# Patient Record
Sex: Female | Born: 1955 | Race: White | Hispanic: No
Health system: Southern US, Community
[De-identification: ages and names within clinical notes are randomized; demographics above are authoritative.]

---

## 2013-10-18 ENCOUNTER — Other Ambulatory Visit: Payer: Self-pay

## 2013-10-18 DIAGNOSIS — Z1231 Encounter for screening mammogram for malignant neoplasm of breast: Secondary | ICD-10-CM

## 2014-05-21 ENCOUNTER — Ambulatory Visit
Admission: RE | Admit: 2014-05-21 | Discharge: 2014-05-21 | Disposition: A | Discharge status: Home / Self Care | Payer: BLUE CROSS/BLUE SHIELD | Source: Ambulatory Visit

## 2014-05-21 DIAGNOSIS — Z1231 Encounter for screening mammogram for malignant neoplasm of breast: Secondary | ICD-10-CM

## 2016-02-23 IMAGING — MG MM SCREENING BREAST TOMO BILATERAL
12 series · 12 of 28 positions shown · non-contrast
Comparison: Previous exam(s).

CLINICAL DATA: Screening.

EXAM:
DIGITAL SCREENING BILATERAL MAMMOGRAM WITH 3D TOMO WITH CAD

[R MLO]
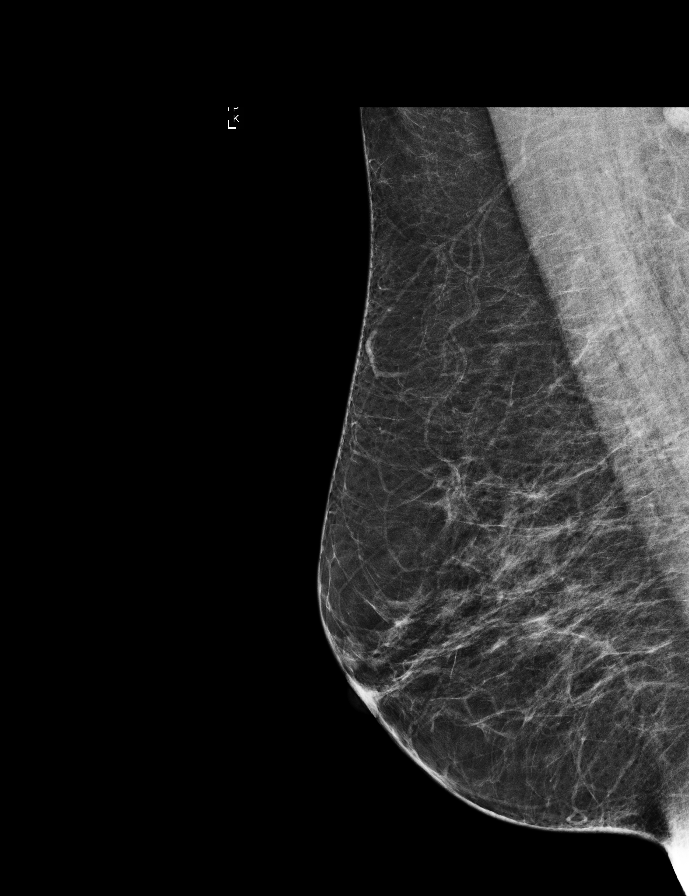

[R CC]
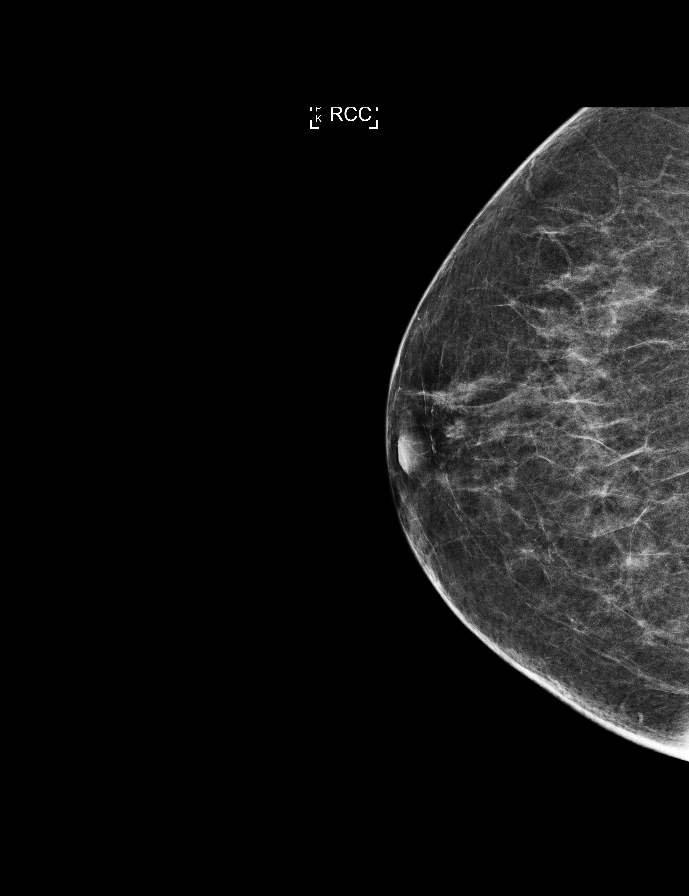

[L MLO]
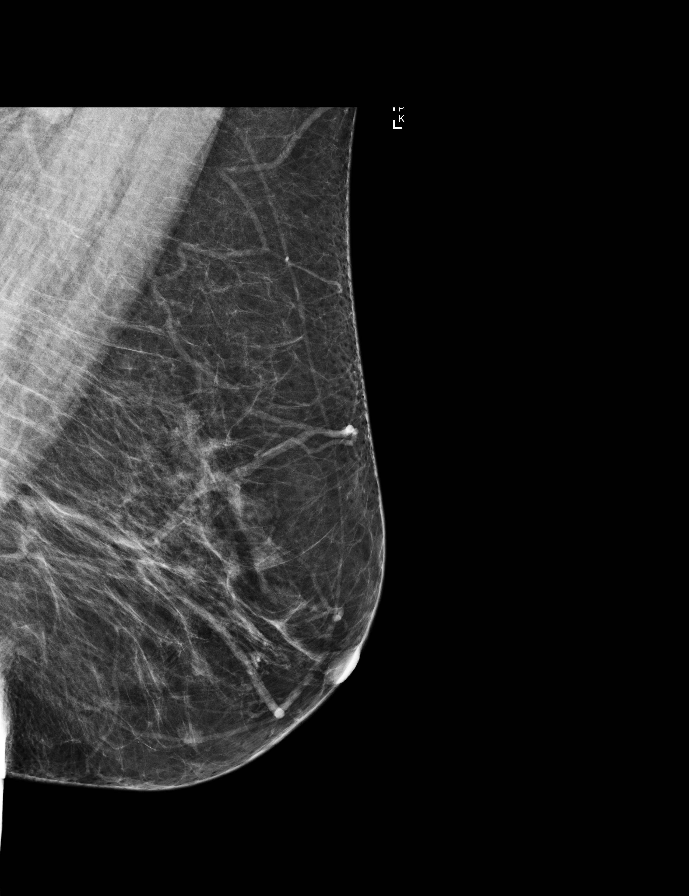

[L CC]
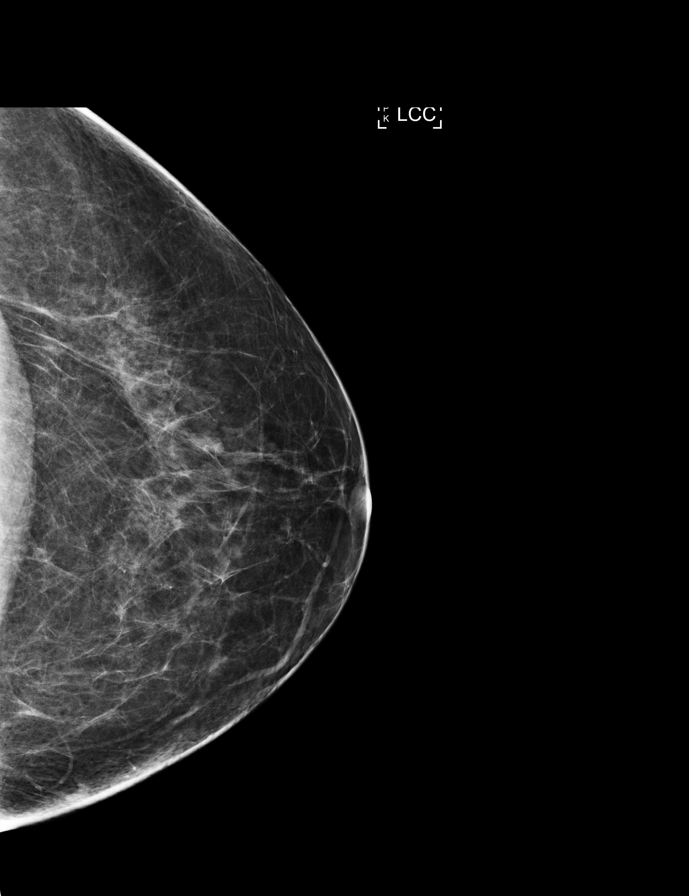

[L CC tomo (1 of 2)]
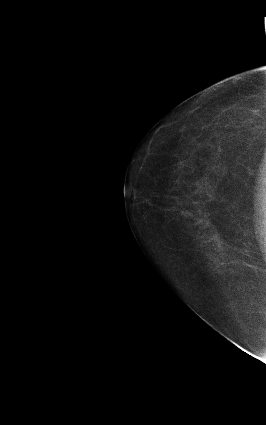

[R CC tomo (1 of 2)]
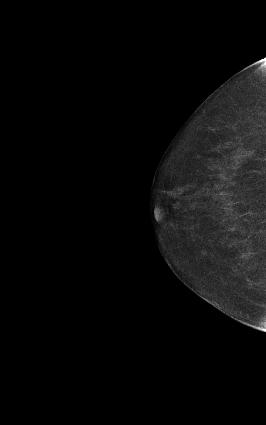

[L MLO tomo (1 of 2)]
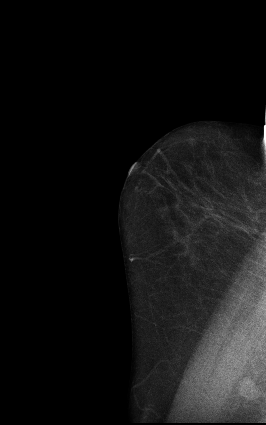

[R MLO tomo (1 of 2)]
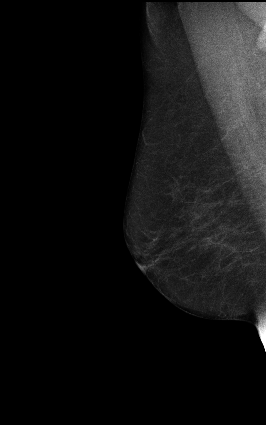

[R MLO tomo (2 of 2) · tomo slice 33/65.0]
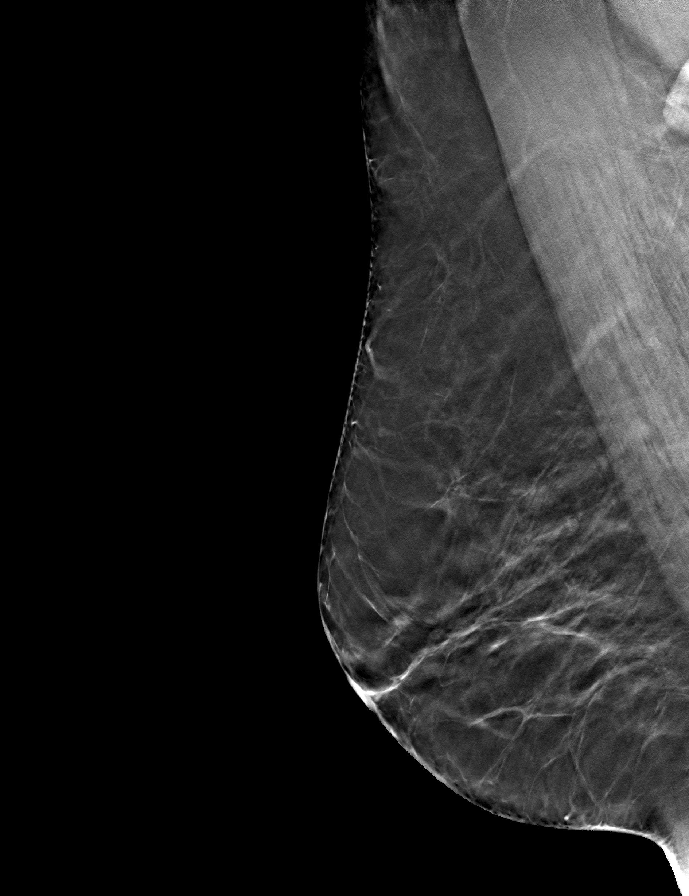

[L CC tomo (2 of 2) · tomo slice 35/69.0]
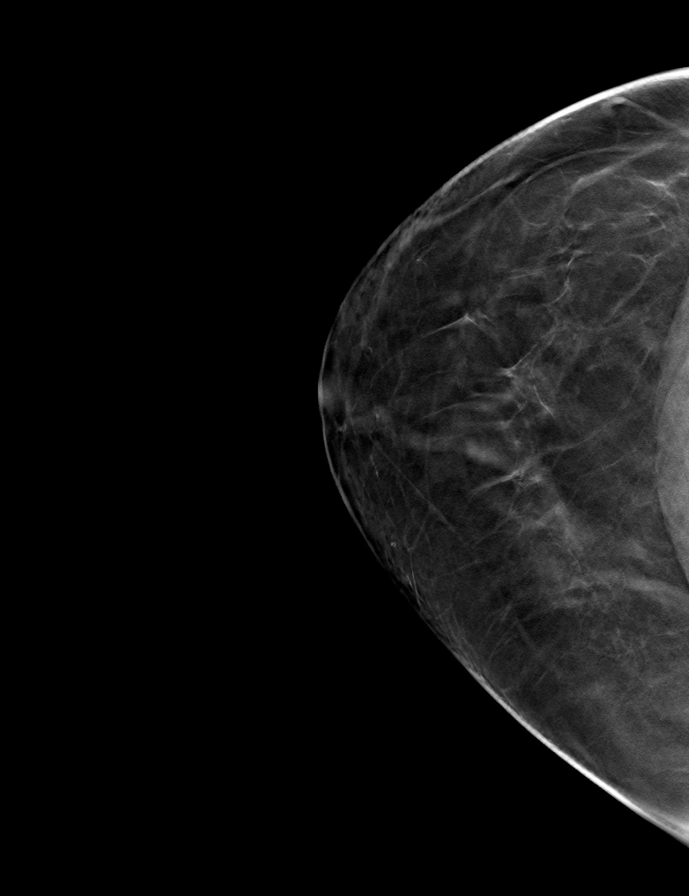

[L MLO tomo (2 of 2) · tomo slice 35/68.0]
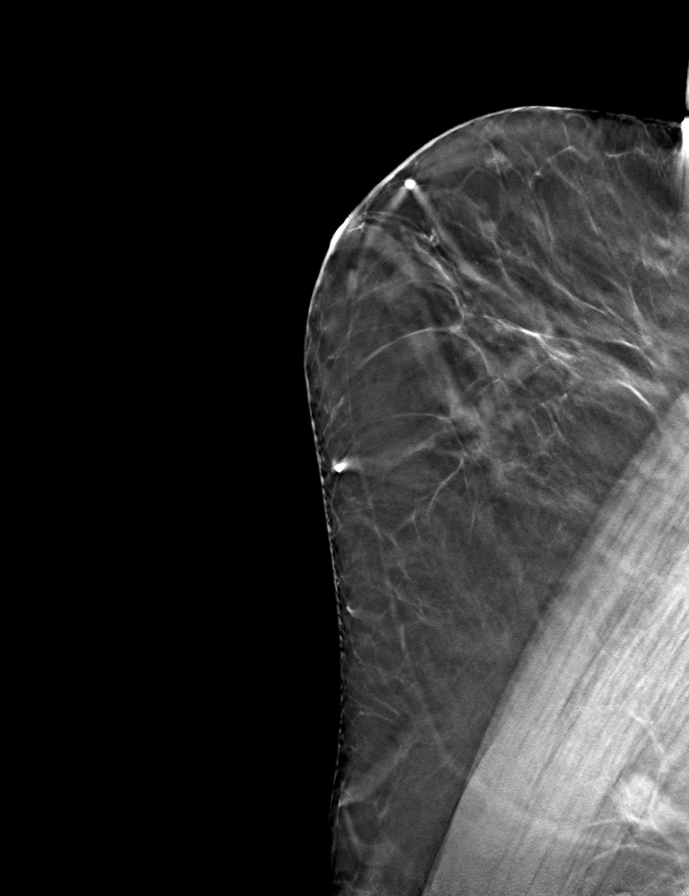

[R CC tomo (2 of 2) · tomo slice 29/58.0]
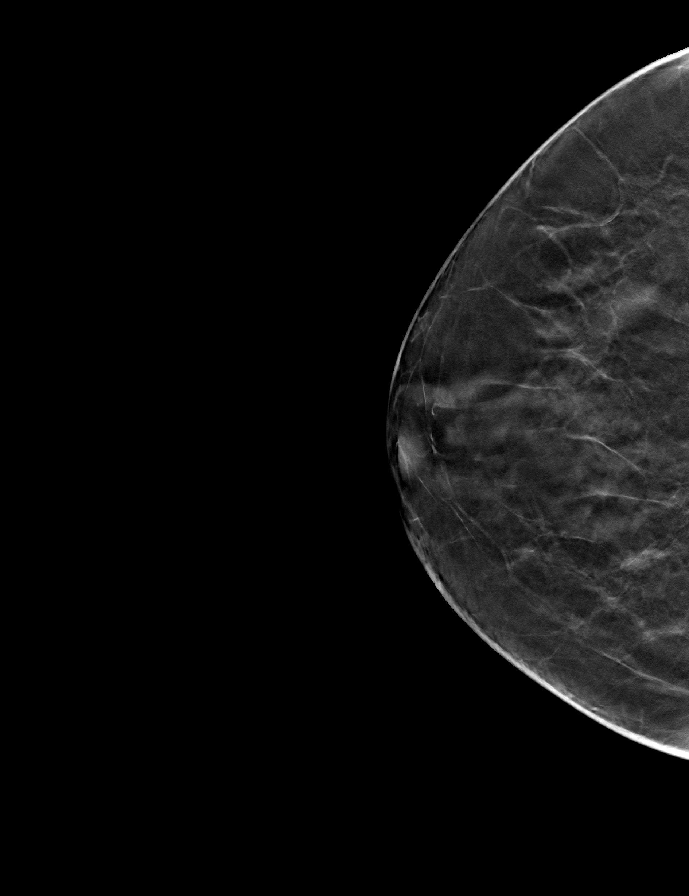

[12 of 28 positions shown; findings below may reference images not displayed]

ACR Breast Density Category b: There are scattered areas of
fibroglandular density.
FINDINGS: There are no findings suspicious for malignancy. Images were
processed with CAD.
IMPRESSION: No mammographic evidence of malignancy. A result letter of this
screening mammogram will be mailed directly to the patient.

RECOMMENDATION:
Screening mammogram in one year. (Code:55-L-23V)

BI-RADS CATEGORY  1: Negative.

## 2019-01-17 ENCOUNTER — Other Ambulatory Visit: Payer: Self-pay

## 2019-01-17 ENCOUNTER — Other Ambulatory Visit: Payer: Self-pay | Admitting: Podiatry

## 2019-01-17 ENCOUNTER — Ambulatory Visit (INDEPENDENT_AMBULATORY_CARE_PROVIDER_SITE_OTHER): Payer: Commercial Managed Care - PPO

## 2019-01-17 ENCOUNTER — Ambulatory Visit: Payer: Commercial Managed Care - PPO | Admitting: Podiatry

## 2019-01-17 DIAGNOSIS — M2041 Other hammer toe(s) (acquired), right foot: Secondary | ICD-10-CM

## 2019-01-17 DIAGNOSIS — M79674 Pain in right toe(s): Secondary | ICD-10-CM | POA: Diagnosis not present

## 2019-01-17 DIAGNOSIS — M79671 Pain in right foot: Secondary | ICD-10-CM

## 2019-01-17 NOTE — Patient Instructions (Signed)
Pre-Operative Instructions  Congratulations, you have decided to take an important step towards improving your quality of life.  You can be assured that the doctors and staff at [facility] will be with you every step of the way.  Here are some important things you should know:  1. Plan to be at the surgery center/hospital at least 1 (one) hour prior to your scheduled time, unless otherwise directed by the surgical center/hospital staff.  You must have a responsible adult accompany you, remain during the surgery and drive you home.  Make sure you have directions to the surgical center/hospital to ensure you arrive on time. 2. If you are having surgery at [facility] or [facility], you will need a copy of your medical history and physical form from your family physician within one month prior to the date of surgery. We will give you a form for your primary physician to complete.  3. We make every effort to accommodate the date you request for surgery.  However, there are times where surgery dates or times have to be moved.  We will contact you as soon as possible if a change in schedule is required.   4. No aspirin/ibuprofen for one week before surgery.  If you are on aspirin, any non-steroidal anti-inflammatory medications (Mobic, Aleve, Ibuprofen) should not be taken seven (7) days prior to your surgery.  You make take Tylenol for pain prior to surgery.  5. Medications - If you are taking daily heart and blood pressure medications, seizure, reflux, allergy, asthma, anxiety, pain or diabetes medications, make sure you notify the surgery center/hospital before the day of surgery so they can tell you which medications you should take or avoid the day of surgery. 6. No food or drink after midnight the night before surgery unless directed otherwise by surgical center/hospital staff. 7. No alcoholic beverages 24-hours prior to surgery.  No smoking 24-hours prior or 24-hours after  surgery. 8. Wear loose pants or shorts. They should be loose enough to fit over bandages, boots, and casts. 9. Don't wear slip-on shoes. Sneakers are preferred. 10. Bring your boot with you to the surgery center/hospital.  Also bring crutches or a walker if your physician has prescribed it for you.  If you do not have this equipment, it will be provided for you after surgery. 11. If you have not been contacted by the surgery center/hospital by the day before your surgery, call to confirm the date and time of your surgery. 12. Leave-time from work may vary depending on the type of surgery you have.  Appropriate arrangements should be made prior to surgery with your employer. 13. Prescriptions will be provided immediately following surgery by your doctor.  Fill these as soon as possible after surgery and take the medication as directed. Pain medications will not be refilled on weekends and must be approved by the doctor. 14. Remove nail polish on the operative foot and avoid getting pedicures prior to surgery. 15. Wash the night before surgery.  The night before surgery wash the foot and leg well with water and the antibacterial soap provided. Be sure to pay special attention to beneath the toenails and in between the toes.  Wash for at least three (3) minutes. Rinse thoroughly with water and dry well with a towel.  Perform this wash unless told not to do so by your physician.  Enclosed: 1 Ice pack (please put in freezer the night before surgery)   1 Hibiclens skin cleaner     Pre-op instructions  If you have any questions regarding the instructions, please do not hesitate to call our office.  [place]: [address], [place], [place] [address] -- [phone]  [place]: [address] [place], [place] [address] -- [phone]  [place]: [address]  [place], [place] [address] -- [phone]   Website: [email or web address] 

## 2019-01-30 ENCOUNTER — Other Ambulatory Visit: Payer: Self-pay | Admitting: Podiatry

## 2019-01-30 DIAGNOSIS — M2041 Other hammer toe(s) (acquired), right foot: Secondary | ICD-10-CM

## 2019-02-12 NOTE — Progress Notes (Signed)
  Subjective:  Patient ID: Maureen Pacheco, female    DOB: [birth year],  MRN: [id number]  No chief complaint on file.   63 y.o. female presents for concern of a toe problem to the right foot.  States that the right second toe is curving and causing pain.  States that it feels like it is walking on the inside of the toe.  Present for 1 year.  9 out of 10 sharp pains worse after a fall.  Denies redness denies swelling has been using ibuprofen without relief.  Review of Systems: Negative except as noted in the HPI. Denies N/V/F/Ch.  No past medical history on file. No current outpatient medications on file.  Social History   Tobacco Use  Smoking Status Not on file    Allergies  Allergen Reactions  . Codeine Nausea And Vomiting and Nausea Only    Bad nausea and vomiting   . Sulfa Antibiotics Other (See Comments) and Rash    Swelling, rash   . Carbinoxamine     Other reaction(s): Hallucinations  . Fentanyl   . Rondec-D  [Chlophedianol-Pseudoephedrine] Other (See Comments)   Objective:  There were no vitals filed for this visit. There is no height or weight on file to calculate BMI. Constitutional Well developed. Well nourished.  Vascular Dorsalis pedis pulses palpable bilaterally. Posterior tibial pulses palpable bilaterally. Capillary refill normal to all digits.  No cyanosis or clubbing noted. Pedal hair growth normal.  Neurologic Normal speech. Oriented to person, place, and time. Epicritic sensation to light touch grossly present bilaterally.  Dermatologic Nails well groomed and normal in appearance. No open wounds. No skin lesions.  Orthopedic:  Pain palpation about the right second toe with hammertoe formation   Assessment:   1. Hammertoe of right foot   2. Pain in toe of right foot    Plan:  Patient was evaluated and treated and all questions answered.  Hammertoe right second toe -Educated etiology.  Discussed padding and shoe gear options.  Should pain  persist would consider surgical intervention.  No follow-ups on file.

## 2019-10-11 ENCOUNTER — Telehealth: Payer: Self-pay
